# Patient Record
Sex: Male | Born: 1960 | Race: White | Hispanic: No | Marital: Married | Smoking: Never smoker
Health system: Southern US, Community
[De-identification: ages and names within clinical notes are randomized; demographics above are authoritative.]

## PROBLEM LIST (undated history)

## (undated) ENCOUNTER — Emergency Department (HOSPITAL_BASED_OUTPATIENT_CLINIC_OR_DEPARTMENT_OTHER): Admission: EM | Payer: 59 | Source: Home / Self Care

## (undated) DIAGNOSIS — G4730 Sleep apnea, unspecified: Secondary | ICD-10-CM

## (undated) DIAGNOSIS — I10 Essential (primary) hypertension: Secondary | ICD-10-CM

## (undated) DIAGNOSIS — E785 Hyperlipidemia, unspecified: Secondary | ICD-10-CM

## (undated) HISTORY — DX: Hyperlipidemia, unspecified: E78.5

## (undated) HISTORY — DX: Sleep apnea, unspecified: G47.30

## (undated) HISTORY — DX: Essential (primary) hypertension: I10

---

## 2020-03-23 ENCOUNTER — Other Ambulatory Visit: Payer: Self-pay | Admitting: Family Medicine

## 2020-03-23 DIAGNOSIS — R1032 Left lower quadrant pain: Secondary | ICD-10-CM

## 2020-04-08 ENCOUNTER — Ambulatory Visit
Admission: RE | Admit: 2020-04-08 | Discharge: 2020-04-08 | Disposition: A | Discharge status: Home / Self Care | Payer: Managed Care, Other (non HMO) | Source: Ambulatory Visit | Attending: Family Medicine | Admitting: Family Medicine

## 2020-04-08 ENCOUNTER — Other Ambulatory Visit: Payer: Self-pay

## 2020-04-08 DIAGNOSIS — R1032 Left lower quadrant pain: Secondary | ICD-10-CM

## 2020-04-08 MED ORDER — IOPAMIDOL (ISOVUE-300) INJECTION 61%
100.0000 mL | Freq: Once | INTRAVENOUS | Status: AC | PRN
  Administered 2020-04-08: 100 mL via INTRAVENOUS

## 2020-08-30 NOTE — Progress Notes (Signed)
 Self Swab Type: Anterior Nasal    Time of patient swab

## 2021-03-19 IMAGING — CT CT ABD-PELV W/ CM
1 of 3 series · 13 of 32 positions shown, 19 images · IV contrast (APPLIED)
Comparison: None.

CLINICAL DATA: Left lower quadrant pain. Previous history of
diverticulitis.

EXAM:
CT ABDOMEN AND PELVIS WITH CONTRAST
TECHNIQUE: Multidetector CT imaging of the abdomen and pelvis was performed
using the standard protocol following bolus administration of
intravenous contrast.
CONTRAST:  100mL R22TW1-AZZ IOPAMIDOL (R22TW1-AZZ) INJECTION 61%

[Series 2: abd/pelvis w/cm · axial · 0.85mm/px · z∈[-479,-29]mm · 13 of 104 slices shown, 19 images]
[im 7/104  soft-tissue]
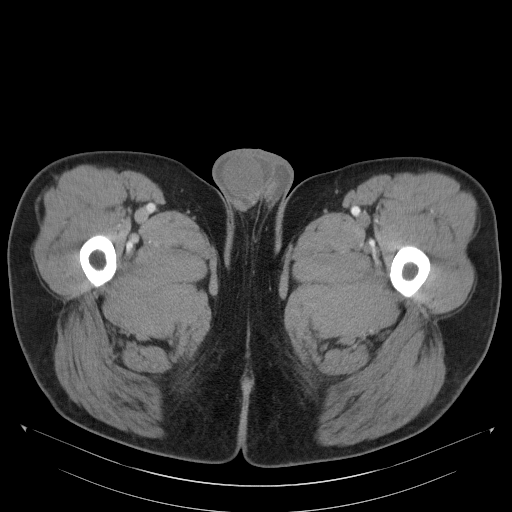
[im 7/104  bone]
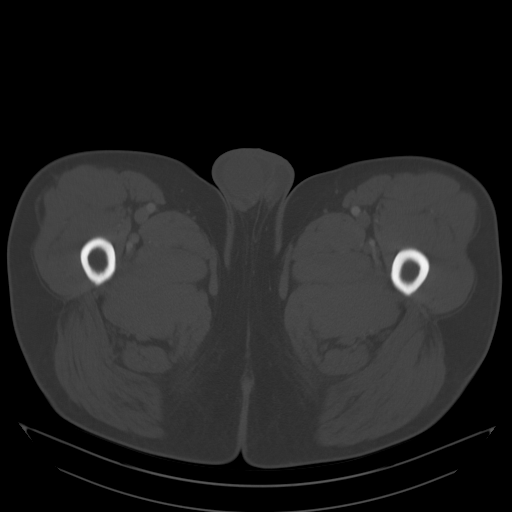
[im 14/104  soft-tissue]
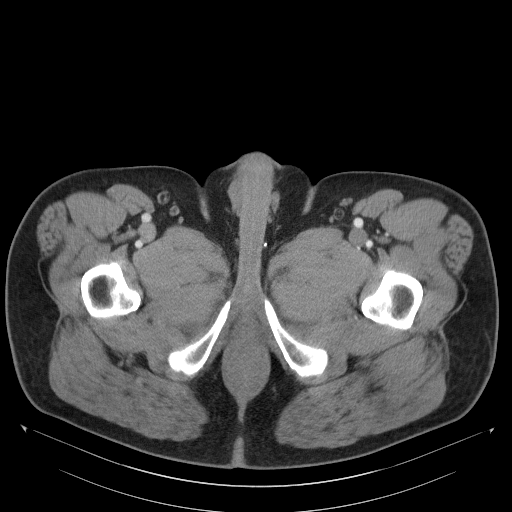
[im 21/104  soft-tissue]
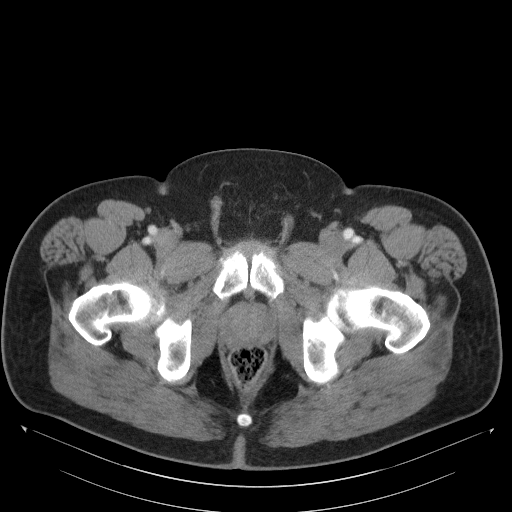
[im 28/104  soft-tissue]
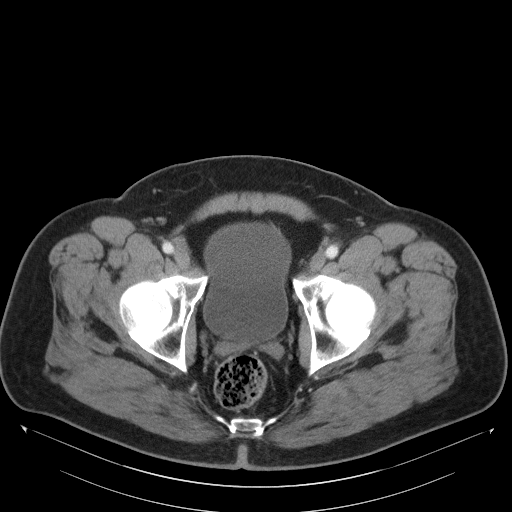
[im 35/104  soft-tissue]
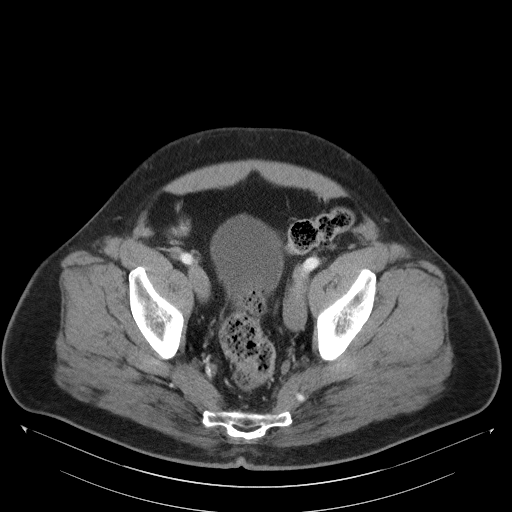
[im 42/104  soft-tissue]
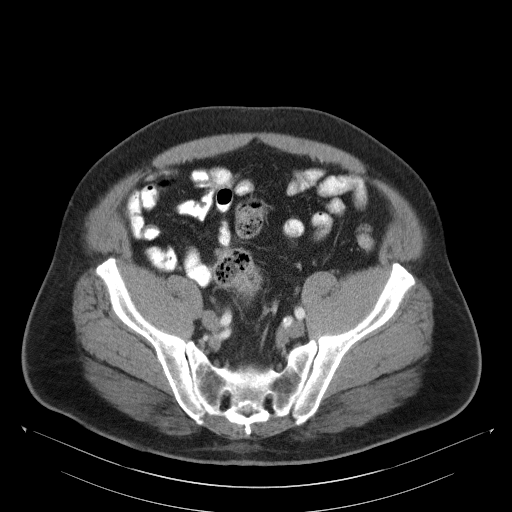
[im 55/104  soft-tissue]
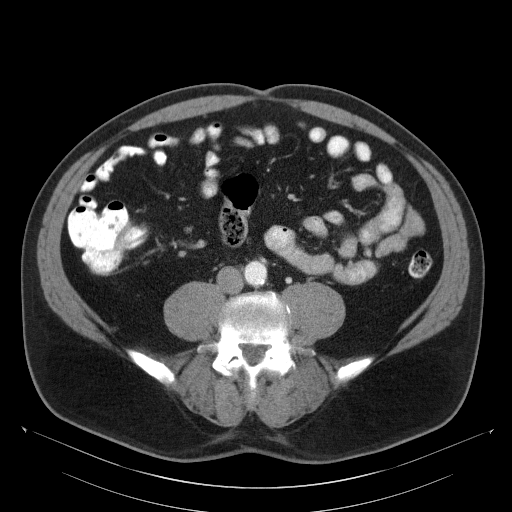
[im 62/104  soft-tissue]
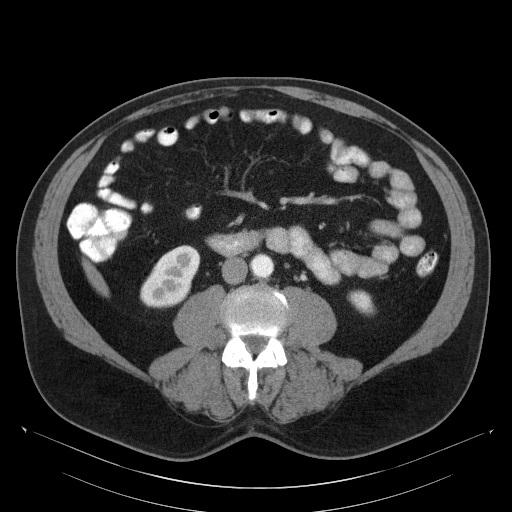
[im 69/104  soft-tissue]
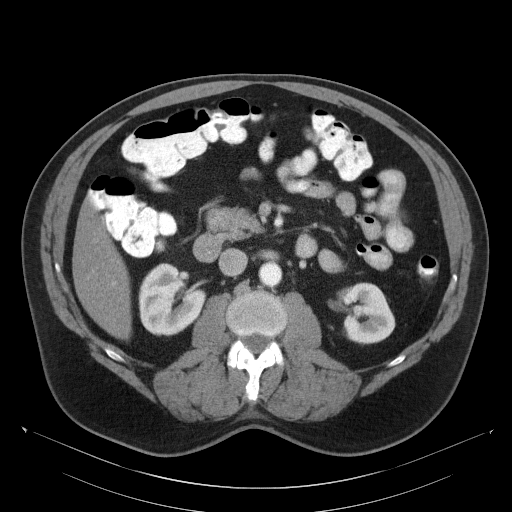
[im 69/104  bone]
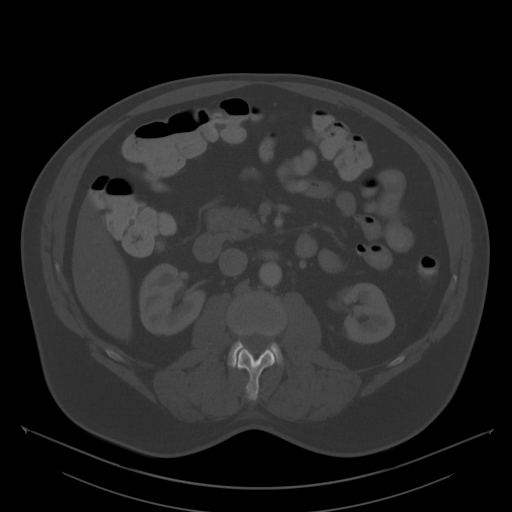
[im 76/104  soft-tissue]
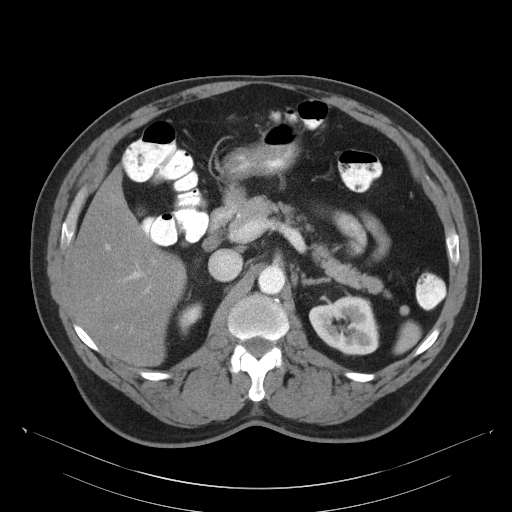
[im 76/104  lung]
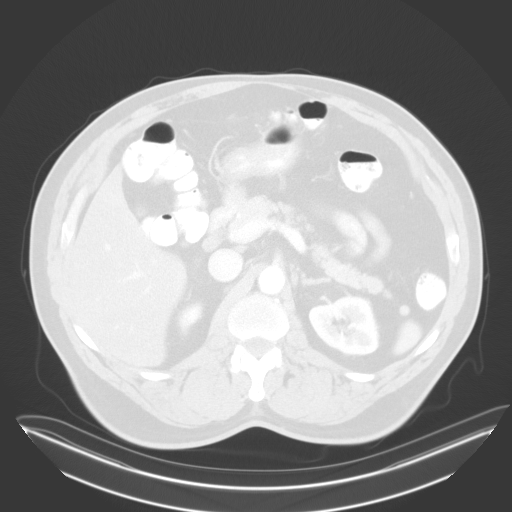
[im 83/104  soft-tissue]
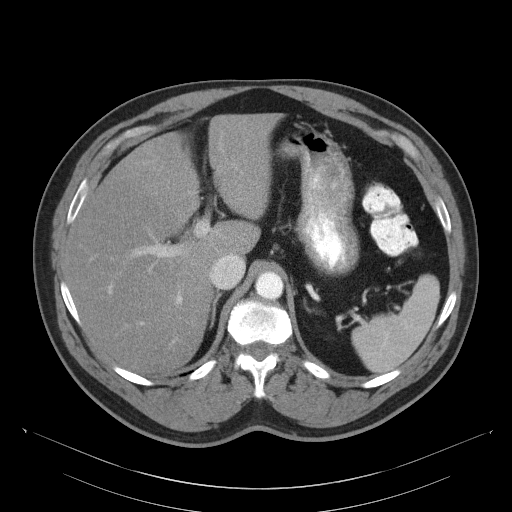
[im 83/104  lung]
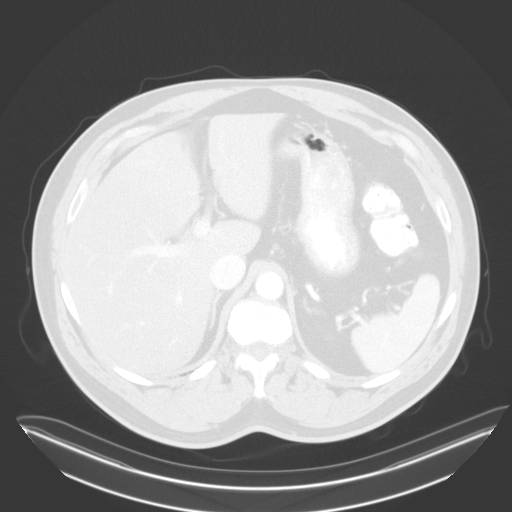
[im 90/104  soft-tissue]
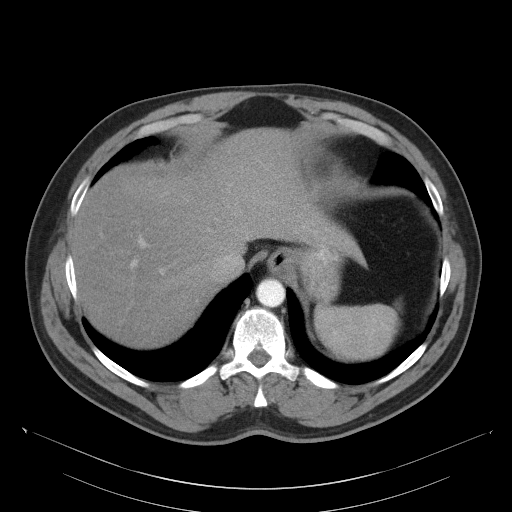
[im 90/104  lung]
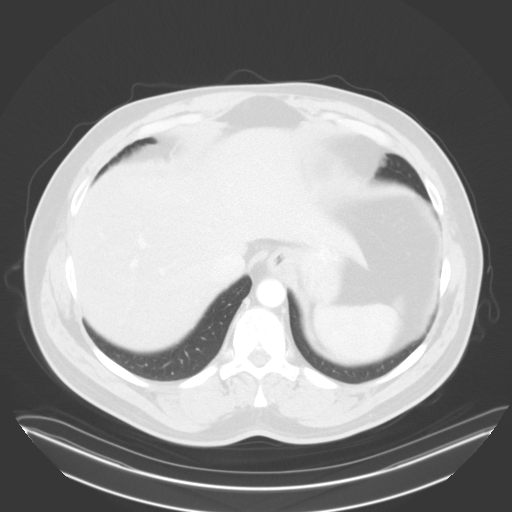
[im 97/104  soft-tissue]
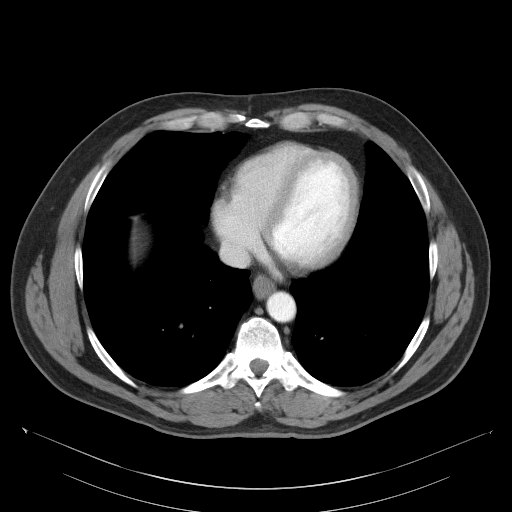
[im 97/104  lung]
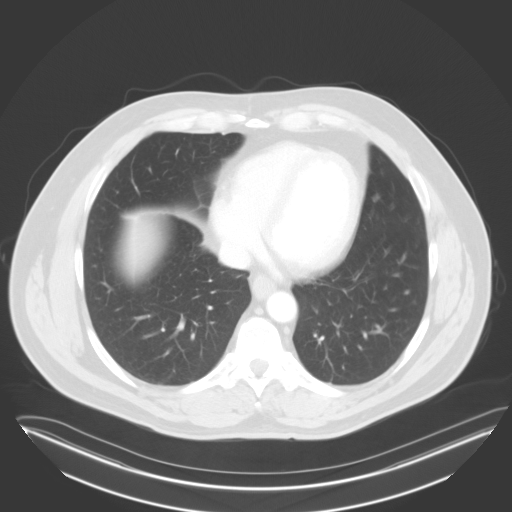

[13 of 32 positions shown; findings below may reference images not displayed]

FINDINGS: Lower Chest: No acute findings.

Hepatobiliary: No hepatic masses identified. Moderate diffuse
hepatic steatosis is seen. Gallbladder is unremarkable. No evidence
of biliary ductal dilatation.

Pancreas:  No mass or inflammatory changes.

Spleen: Within normal limits in size and appearance.

Adrenals/Urinary Tract: No masses identified. No evidence of
ureteral calculi or hydronephrosis.

Stomach/Bowel: No evidence of obstruction, inflammatory process or
abnormal fluid collections. Mild diverticulosis is seen involving
the distal descending and proximal sigmoid colon, however there is
no evidence of diverticulitis.

Vascular/Lymphatic: No pathologically enlarged lymph nodes. No
abdominal aortic aneurysm. Mild aortic atherosclerotic calcification
noted.

Reproductive:  No mass or other significant abnormality.

Other:  None.

Musculoskeletal:  No suspicious bone lesions identified.
IMPRESSION: Mild colonic diverticulosis. No radiographic evidence of
diverticulitis or other acute findings.

Moderate hepatic steatosis.

Aortic Atherosclerosis ([id number].J).

## 2022-08-16 ENCOUNTER — Other Ambulatory Visit: Payer: Self-pay

## 2022-08-16 ENCOUNTER — Encounter (HOSPITAL_BASED_OUTPATIENT_CLINIC_OR_DEPARTMENT_OTHER): Payer: Self-pay | Admitting: Emergency Medicine

## 2022-08-16 ENCOUNTER — Ambulatory Visit (HOSPITAL_BASED_OUTPATIENT_CLINIC_OR_DEPARTMENT_OTHER)
Admission: RE | Admit: 2022-08-16 | Discharge: 2022-08-16 | Disposition: A | Discharge status: Home / Self Care | Payer: 59 | Source: Ambulatory Visit | Attending: Family Medicine | Admitting: Family Medicine

## 2022-08-16 ENCOUNTER — Other Ambulatory Visit (HOSPITAL_BASED_OUTPATIENT_CLINIC_OR_DEPARTMENT_OTHER): Payer: Self-pay | Admitting: Family Medicine

## 2022-08-16 DIAGNOSIS — R0602 Shortness of breath: Secondary | ICD-10-CM | POA: Insufficient documentation

## 2022-08-16 NOTE — ED Triage Notes (Deleted)
Pt arrives to ED with c/o SOB for several weeks that tends to be worse in the mornings.

## 2023-10-08 ENCOUNTER — Inpatient Hospital Stay (HOSPITAL_COMMUNITY)
Admission: EM | Disposition: A | Discharge status: Home / Self Care | Payer: Self-pay | Source: Home / Self Care | Attending: Cardiovascular Disease

## 2023-10-08 ENCOUNTER — Other Ambulatory Visit: Payer: Self-pay

## 2023-10-08 ENCOUNTER — Inpatient Hospital Stay (HOSPITAL_BASED_OUTPATIENT_CLINIC_OR_DEPARTMENT_OTHER)
Admission: EM | Admit: 2023-10-08 | Discharge: 2023-10-08 | DRG: 287 | Disposition: A | Discharge status: Home / Self Care | Attending: Cardiovascular Disease | Admitting: Cardiovascular Disease

## 2023-10-08 ENCOUNTER — Emergency Department (HOSPITAL_BASED_OUTPATIENT_CLINIC_OR_DEPARTMENT_OTHER): Admitting: Radiology

## 2023-10-08 ENCOUNTER — Encounter (HOSPITAL_BASED_OUTPATIENT_CLINIC_OR_DEPARTMENT_OTHER): Payer: Self-pay

## 2023-10-08 ENCOUNTER — Inpatient Hospital Stay (HOSPITAL_COMMUNITY)

## 2023-10-08 DIAGNOSIS — R9431 Abnormal electrocardiogram [ECG] [EKG]: Secondary | ICD-10-CM

## 2023-10-08 DIAGNOSIS — R079 Chest pain, unspecified: Secondary | ICD-10-CM | POA: Diagnosis not present

## 2023-10-08 DIAGNOSIS — I10 Essential (primary) hypertension: Secondary | ICD-10-CM | POA: Diagnosis present

## 2023-10-08 DIAGNOSIS — M26629 Arthralgia of temporomandibular joint, unspecified side: Principal | ICD-10-CM

## 2023-10-08 DIAGNOSIS — Z888 Allergy status to other drugs, medicaments and biological substances status: Secondary | ICD-10-CM

## 2023-10-08 DIAGNOSIS — E785 Hyperlipidemia, unspecified: Secondary | ICD-10-CM | POA: Diagnosis present

## 2023-10-08 DIAGNOSIS — Z79899 Other long term (current) drug therapy: Secondary | ICD-10-CM | POA: Diagnosis not present

## 2023-10-08 DIAGNOSIS — R0789 Other chest pain: Secondary | ICD-10-CM | POA: Diagnosis present

## 2023-10-08 DIAGNOSIS — R6884 Jaw pain: Secondary | ICD-10-CM | POA: Diagnosis present

## 2023-10-08 DIAGNOSIS — G4733 Obstructive sleep apnea (adult) (pediatric): Secondary | ICD-10-CM | POA: Diagnosis present

## 2023-10-08 DIAGNOSIS — I213 ST elevation (STEMI) myocardial infarction of unspecified site: Secondary | ICD-10-CM

## 2023-10-08 HISTORY — PX: LEFT HEART CATH AND CORONARY ANGIOGRAPHY: CATH118249

## 2023-10-08 LAB — RESP PANEL BY RT-PCR (RSV, FLU A&B, COVID)  RVPGX2
Influenza A by PCR: NEGATIVE
Influenza B by PCR: NEGATIVE
Resp Syncytial Virus by PCR: NEGATIVE
SARS Coronavirus 2 by RT PCR: NEGATIVE

## 2023-10-08 LAB — BASIC METABOLIC PANEL WITH GFR
Anion gap: 12 (ref 5–15)
BUN: 20 mg/dL (ref 8–23)
CO2: 18 mmol/L — ABNORMAL LOW (ref 22–32)
Calcium: 9.1 mg/dL (ref 8.9–10.3)
Chloride: 107 mmol/L (ref 98–111)
Creatinine, Ser: 1.26 mg/dL — ABNORMAL HIGH (ref 0.61–1.24)
GFR, Estimated: >60 mL/min (ref >60)
Glucose, Bld: 96 mg/dL (ref 70–99)
Potassium: 4.5 mmol/L (ref 3.5–5.1)
Sodium: 138 mmol/L (ref 135–145)

## 2023-10-08 LAB — MRSA NEXT GEN BY PCR, NASAL: MRSA by PCR Next Gen: NOT DETECTED

## 2023-10-08 LAB — HEMOGLOBIN A1C
Hgb A1c MFr Bld: 5.3 % (ref 4.8–5.6)
Mean Plasma Glucose: 105.41 mg/dL

## 2023-10-08 LAB — LIPID PANEL
Cholesterol: 111 mg/dL (ref 0–200)
HDL: 27 mg/dL — ABNORMAL LOW (ref >40)
LDL Cholesterol: 57 mg/dL (ref 0–99)
Total CHOL/HDL Ratio: 4.1 ratio
Triglycerides: 133 mg/dL (ref <150)
VLDL: 27 mg/dL (ref 0–40)

## 2023-10-08 LAB — LACTIC ACID, PLASMA: Lactic Acid, Venous: 0.7 mmol/L (ref 0.5–1.9)

## 2023-10-08 LAB — CBC WITH DIFFERENTIAL/PLATELET
Abs Immature Granulocytes: 0.01 K/uL (ref 0.00–0.07)
Basophils Absolute: 0.1 K/uL (ref 0.0–0.1)
Basophils Relative: 1 %
Eosinophils Absolute: 0.5 K/uL (ref 0.0–0.5)
Eosinophils Relative: 6 %
HCT: 38.2 % — ABNORMAL LOW (ref 39.0–52.0)
Hemoglobin: 12.8 g/dL — ABNORMAL LOW (ref 13.0–17.0)
Immature Granulocytes: 0 %
Lymphocytes Relative: 32 %
Lymphs Abs: 2.9 K/uL (ref 0.7–4.0)
MCH: 31 pg (ref 26.0–34.0)
MCHC: 33.5 g/dL (ref 30.0–36.0)
MCV: 92.5 fL (ref 80.0–100.0)
Monocytes Absolute: 0.8 K/uL (ref 0.1–1.0)
Monocytes Relative: 9 %
Neutro Abs: 4.8 K/uL (ref 1.7–7.7)
Neutrophils Relative %: 52 %
Platelets: 275 K/uL (ref 150–400)
RBC: 4.13 MIL/uL — ABNORMAL LOW (ref 4.22–5.81)
RDW: 13.9 % (ref 11.5–15.5)
WBC: 9.1 K/uL (ref 4.0–10.5)
nRBC: 0 % (ref 0.0–0.2)

## 2023-10-08 LAB — HEPATIC FUNCTION PANEL
ALT: 22 U/L (ref 0–44)
AST: 29 U/L (ref 15–41)
Albumin: 4.3 g/dL (ref 3.5–5.0)
Alkaline Phosphatase: 124 U/L (ref 38–126)
Bilirubin, Direct: 0.1 mg/dL (ref 0.0–0.2)
Indirect Bilirubin: 0.2 mg/dL — ABNORMAL LOW (ref 0.3–0.9)
Total Bilirubin: 0.3 mg/dL (ref 0.0–1.2)
Total Protein: 7 g/dL (ref 6.5–8.1)

## 2023-10-08 LAB — ECHOCARDIOGRAM COMPLETE
Area-P 1/2: 4.06 cm2
Height: 71 in
S' Lateral: 2.9 cm
Weight: 3425.07 [oz_av]

## 2023-10-08 LAB — TROPONIN T, HIGH SENSITIVITY: Troponin T High Sensitivity: <15 ng/L (ref <19)

## 2023-10-08 SURGERY — LEFT HEART CATH AND CORONARY ANGIOGRAPHY
Anesthesia: LOCAL

## 2023-10-08 MED ORDER — HEPARIN SODIUM (PORCINE) 5000 UNIT/ML IJ SOLN
4000.0000 [IU] | Freq: Once | INTRAMUSCULAR | Status: AC
  Administered 2023-10-08: 4000 [IU] via INTRAVENOUS
  Filled 2023-10-08: qty 1

## 2023-10-08 MED ORDER — SODIUM CHLORIDE 0.9 % IV SOLN: INTRAVENOUS | Status: AC

## 2023-10-08 MED ORDER — HEPARIN SODIUM (PORCINE) 1000 UNIT/ML IJ SOLN
INTRAMUSCULAR | Status: DC | PRN
  Administered 2023-10-08: 3000 [IU] via INTRAVENOUS

## 2023-10-08 MED ORDER — SODIUM CHLORIDE 0.9 % IV SOLN
250.0000 mL | INTRAVENOUS | Status: DC | PRN
Start: 2023-10-08 — End: 2023-10-08

## 2023-10-08 MED ORDER — SODIUM CHLORIDE 0.9% FLUSH: 3.0000 mL | INTRAVENOUS | Status: DC | PRN

## 2023-10-08 MED ORDER — FENTANYL CITRATE (PF) 100 MCG/2ML IJ SOLN
INTRAMUSCULAR | Status: DC | PRN
  Administered 2023-10-08: 50 ug via INTRAVENOUS

## 2023-10-08 MED ORDER — HEPARIN (PORCINE) IN NACL 1000-0.9 UT/500ML-% IV SOLN
INTRAVENOUS | Status: DC | PRN
  Administered 2023-10-08 (×2): 500 mL

## 2023-10-08 MED ORDER — ACETAMINOPHEN 325 MG PO TABS: 650.0000 mg | ORAL_TABLET | ORAL | Status: DC | PRN

## 2023-10-08 MED ORDER — SODIUM CHLORIDE 0.9 % IV SOLN
INTRAVENOUS | Status: AC | PRN
  Administered 2023-10-08: 10 mL/h via INTRAVENOUS

## 2023-10-08 MED ORDER — CHLORHEXIDINE GLUCONATE CLOTH 2 % EX PADS
6.0000 | MEDICATED_PAD | Freq: Every day | CUTANEOUS | Status: DC
  Administered 2023-10-08: 6 via TOPICAL

## 2023-10-08 MED ORDER — LIDOCAINE HCL (PF) 1 % IJ SOLN
INTRAMUSCULAR | Status: AC
  Filled 2023-10-08: qty 30

## 2023-10-08 MED ORDER — SODIUM CHLORIDE 0.9% FLUSH
3.0000 mL | Freq: Two times a day (BID) | INTRAVENOUS | Status: DC
  Administered 2023-10-08: 3 mL via INTRAVENOUS

## 2023-10-08 MED ORDER — ASPIRIN 81 MG PO CHEW
324.0000 mg | CHEWABLE_TABLET | Freq: Once | ORAL | Status: AC
  Administered 2023-10-08: 324 mg via ORAL
  Filled 2023-10-08: qty 4

## 2023-10-08 MED ORDER — MIDAZOLAM HCL 2 MG/2ML IJ SOLN
INTRAMUSCULAR | Status: AC
  Filled 2023-10-08: qty 2

## 2023-10-08 MED ORDER — VERAPAMIL HCL 2.5 MG/ML IV SOLN
INTRAVENOUS | Status: AC
  Filled 2023-10-08: qty 2

## 2023-10-08 MED ORDER — IOHEXOL 350 MG/ML SOLN
INTRAVENOUS | Status: DC | PRN
  Administered 2023-10-08: 35 mL via INTRA_ARTERIAL

## 2023-10-08 MED ORDER — HEPARIN SODIUM (PORCINE) 1000 UNIT/ML IJ SOLN
INTRAMUSCULAR | Status: AC
  Filled 2023-10-08: qty 10

## 2023-10-08 MED ORDER — MIDAZOLAM HCL 2 MG/2ML IJ SOLN
INTRAMUSCULAR | Status: DC | PRN
  Administered 2023-10-08: 2 mg via INTRAVENOUS

## 2023-10-08 MED ORDER — VERAPAMIL HCL 2.5 MG/ML IV SOLN
INTRAVENOUS | Status: DC | PRN
  Administered 2023-10-08: 10 mL via INTRA_ARTERIAL

## 2023-10-08 MED ORDER — METOPROLOL SUCCINATE ER 25 MG PO TB24: 12.5000 mg | ORAL_TABLET | Freq: Every day | ORAL | 0 refills | Status: DC

## 2023-10-08 MED ORDER — HYDRALAZINE HCL 20 MG/ML IJ SOLN: 10.0000 mg | INTRAMUSCULAR | Status: AC | PRN

## 2023-10-08 MED ORDER — EZETIMIBE 10 MG PO TABS: 10.0000 mg | ORAL_TABLET | Freq: Every day | ORAL | 2 refills | Status: AC

## 2023-10-08 MED ORDER — SODIUM CHLORIDE 0.9 % IV SOLN
INTRAVENOUS | Status: DC
  Administered 2023-10-08: 10 mL/h via INTRAVENOUS

## 2023-10-08 MED ORDER — ONDANSETRON HCL 4 MG/2ML IJ SOLN: 4.0000 mg | Freq: Four times a day (QID) | INTRAMUSCULAR | Status: DC | PRN

## 2023-10-08 MED ORDER — LABETALOL HCL 5 MG/ML IV SOLN: 10.0000 mg | INTRAVENOUS | Status: AC | PRN

## 2023-10-08 MED ORDER — PERFLUTREN LIPID MICROSPHERE
1.0000 mL | INTRAVENOUS | Status: AC | PRN
  Administered 2023-10-08: 2 mL via INTRAVENOUS

## 2023-10-08 MED ORDER — HEPARIN (PORCINE) 25000 UT/250ML-% IV SOLN: 1200.0000 [IU]/h | INTRAVENOUS | Status: DC

## 2023-10-08 MED ORDER — FENTANYL CITRATE (PF) 100 MCG/2ML IJ SOLN
INTRAMUSCULAR | Status: AC
  Filled 2023-10-08: qty 2

## 2023-10-08 SURGICAL SUPPLY — 7 items
CATH 5FR JL3.5 JR4 ANG PIG MP (CATHETERS) IMPLANT
DEVICE RAD COMP TR BAND LRG (VASCULAR PRODUCTS) IMPLANT
GLIDESHEATH SLEND SS 6F .021 (SHEATH) IMPLANT
GUIDEWIRE INQWIRE 1.5J.035X260 (WIRE) IMPLANT
PACK CARDIAC CATHETERIZATION (CUSTOM PROCEDURE TRAY) ×1 IMPLANT
SET ATX-X65L (MISCELLANEOUS) IMPLANT
STATION PROTECTION PRESSURIZED (MISCELLANEOUS) IMPLANT

## 2023-10-08 NOTE — H&P (Signed)
 Cardiology Admission History and Physical   Patient ID: Stephone Gum MRN: [id number]; DOB: [birth year]   Admission date: 10/08/2023  PCP:  Pcp, No    HeartCare Providers  Chief Complaint:  Jaw pain   History of Present Illness: Mr. Pewitt is a 63 yo male with OSA, HTN and HLD who began having jaw pain earlier today. He went into the [facility] ED and EKG showed 1 mm ST elevation in leads 1, AVL. No chest pain, dyspnea or diaphoresis. Code STEMI called by ED staff. Pt with mild jaw pain on arrival to [facility].    Past Medical History:  Diagnosis Date   Hyperlipidemia    Hypertension    Sleep apnea    History reviewed. No pertinent surgical history.   Medications Prior to Admission: Prior to Admission medications   Medication Sig Start Date End Date Taking? Authorizing Provider  amLODipine (NORVASC) 5 MG tablet Take 5 mg by mouth daily. 09/23/23  Yes [provider]  Pitavastatin Calcium 4 MG TABS Take 1 tablet by mouth at bedtime. 09/24/23  Yes [provider]  lisinopril (ZESTRIL) 40 MG tablet Take 40 mg by mouth every morning.    [provider]  metoprolol  tartrate (LOPRESSOR ) 25 MG tablet Take 25 mg by mouth 2 (two) times daily.    [provider]  omeprazole (PRILOSEC) 20 MG capsule Take 20 mg by mouth daily.    [provider]     Allergies:    Allergies  Allergen Reactions   Statins Other (See Comments)    cramps, muscle aches, tinnitus    Social History:   Social History   Socioeconomic History   Marital status: Married    Spouse name: Not on file   Number of children: Not on file   Years of education: Not on file   Highest education level: Not on file  Occupational History   Not on file  Tobacco Use   Smoking status: Never   Smokeless tobacco: Never  Substance and Sexual Activity   Alcohol use: Not on file   Drug use: Not on file   Sexual activity: Not on file  Other Topics Concern   Not on  file  Social History Narrative   Not on file   Social Drivers of Health   Financial Resource Strain: Not on file  Food Insecurity: No Food Insecurity (10/08/2023)   Hunger Vital Sign    Worried About Running Out of Food in the Last Year: Never true    Ran Out of Food in the Last Year: Never true  Transportation Needs: No Transportation Needs (10/08/2023)   PRAPARE - Administrator, Civil Service (Medical): No    Lack of Transportation (Non-Medical): No  Physical Activity: Not on file  Stress: Not on file  Social Connections: Not on file  Intimate Partner Violence: Not At Risk (10/08/2023)   Humiliation, Afraid, Rape, and Kick questionnaire    Fear of Current or Ex-Partner: No    Emotionally Abused: No    Physically Abused: No    Sexually Abused: No     Family History:   The patient's family history is not on file.    ROS:  Please see the history of present illness.  All other ROS reviewed and negative.     Physical Exam/Data: Vitals:   10/08/23 0515 10/08/23 0545 10/08/23 0600 10/08/23 0700  BP: 124/83 123/85 (!) 116/99 (!) 126/95  Pulse: (!) 57 (!) 52 (!) 47 (!) 50  Resp: 12 13 13  (!) 9  Temp:    98 F (36.7 C)  TempSrc:    Axillary  SpO2: 98% 98% 100% 100%  Weight:    97.1 kg  Height:        Intake/Output Summary (Last 24 hours) at 10/08/2023 0811 Last data filed at 10/08/2023 0743 Gross per 24 hour  Intake 203.87 ml  Output 550 ml  Net -346.13 ml      10/08/2023    7:00 AM 10/08/2023    3:23 AM  Last 3 Weights  Weight (lbs) 214 lb 1.1 oz 230 lb  Weight (kg) 97.1 kg 104.327 kg     Body mass index is 29.86 kg/m.  General:  Well nourished, well developed, in no acute distress HEENT: normal Neck: no JVD Vascular: No carotid bruits; Distal pulses 2+ bilaterally   Cardiac:  normal S1, S2; RRR; no murmur  Lungs:  clear to auscultation bilaterally, no wheezing, rhonchi or rales  Abd: soft, nontender, no hepatomegaly  Ext: no LE  edema Musculoskeletal:  No deformities, BUE and BLE strength normal and equal Skin: warm and dry  Neuro:  CNs 2-12 intact, no focal abnormalities noted Psych:  Normal affect   EKG:  The ECG that was done was personally reviewed and demonstrates sinus with 1 mm ST elevation 1, AVL  Relevant CV Studies:   Laboratory Data: High Sensitivity Troponin:  No results for input(s): TROPONINIHS in the last 720 hours.    Chemistry Recent Labs  Lab 10/08/23 0356  NA 138  K 4.5  CL 107  CO2 18*  GLUCOSE 96  BUN 20  CREATININE 1.26*  CALCIUM 9.1  GFRNONAA >60  ANIONGAP 12    Recent Labs  Lab 10/08/23 0356  PROT 7.0  ALBUMIN 4.3  AST 29  ALT 22  ALKPHOS 124  BILITOT 0.3   Lipids No results for input(s): CHOL, TRIG, HDL, LABVLDL, LDLCALC, CHOLHDL in the last 168 hours. Hematology Recent Labs  Lab 10/08/23 0356  WBC 9.1  RBC 4.13*  HGB 12.8*  HCT 38.2*  MCV 92.5  MCH 31.0  MCHC 33.5  RDW 13.9  PLT 275   Thyroid No results for input(s): TSH, FREET4 in the last 168 hours. BNPNo results for input(s): BNP, PROBNP in the last 168 hours.  DDimer No results for input(s): DDIMER in the last 168 hours.  Radiology/Studies:  CARDIAC CATHETERIZATION Result Date: 10/08/2023 No angiographic evidence of CAD Normal LV function LVEDP 14 mmHg Recommendations: No further ischemia workup. Will admit this morning. Likely discharge later today.   DG Chest 2 View Result Date: 10/08/2023 CLINICAL DATA:  63 year old male with cough, left side jaw pain since last night. EXAM: CHEST - 2 VIEW COMPARISON:  Chest radiograph 08/16/2022. FINDINGS: PA and lateral views. Lung volumes and mediastinal contours remain normal. Visualized tracheal air column is within normal limits. Lung markings appear stable from last year and both lungs appear clear. No pneumothorax or pleural effusion. No acute osseous abnormality identified. Negative visible bowel gas. IMPRESSION: No acute  cardiopulmonary abnormality. Electronically Signed   By: VEAR Hurst M.D.   On: 10/08/2023 03:55     Assessment and Plan: Abnormal EKG: The patient has a mildly abnormal EKG and jaw pain. Plan emergent cardiac cath.    Code Status: Full Code  Severity of Illness: The appropriate patient status for this patient is INPATIENT. Inpatient status is judged to be reasonable and necessary in order to provide the required intensity of service to  ensure the patient's safety. The patient's presenting symptoms, physical exam findings, and initial radiographic and laboratory data in the context of their chronic comorbidities is felt to place them at high risk for further clinical deterioration. Furthermore, it is not anticipated that the patient will be medically stable for discharge from the hospital within 2 midnights of admission.   * I certify that at the point of admission it is my clinical judgment that the patient will require inpatient hospital care spanning beyond 2 midnights from the point of admission due to high intensity of service, high risk for further deterioration and high frequency of surveillance required.*  For questions or updates, please contact [facility] Please consult [email or web address] for contact info under     Signed, Lonni Cash, MD  10/08/2023 8:11 AM

## 2023-10-08 NOTE — Progress Notes (Signed)
  Progress Note  Patient Name: Vincent Davies Date of Encounter: 10/08/2023 [facility] Cardiologist: None new  Interval Summary   No chest pain or dyspnea. Still with left jaw pain at angle. Worse with palpation  Vital Signs Vitals:   10/08/23 0515 10/08/23 0545 10/08/23 0600 10/08/23 0700  BP: 124/83 123/85 (!) 116/99 (!) 126/95  Pulse: (!) 57 (!) 52 (!) 47 (!) 50  Resp: 12 13 13  (!) 9  Temp:    98 F (36.7 C)  TempSrc:    Axillary  SpO2: 98% 98% 100% 100%  Weight:    97.1 kg  Height:        Intake/Output Summary (Last 24 hours) at 10/08/2023 0750 Last data filed at 10/08/2023 0743 Gross per 24 hour  Intake 203.87 ml  Output 550 ml  Net -346.13 ml      10/08/2023    7:00 AM 10/08/2023    3:23 AM  Last 3 Weights  Weight (lbs) 214 lb 1.1 oz 230 lb  Weight (kg) 97.1 kg 104.327 kg      Telemetry/ECG  NSR. One brief pause  - Personally Reviewed  Physical Exam  GEN: No acute distress.   Neck: No JVD Cardiac: RRR, no murmurs, rubs, or gallops.  Respiratory: Clear to auscultation bilaterally. GI: Soft, nontender, non-distended  MS: No edema radial band in place  Assessment & Plan  Jaw pain - musculoskeletal by exam. Recommend NSAID or tylenol  for pain.  Abnormal Ecg. Suspect early repolarization variant. Normal cardiac cath. No further evaluation needed HTN. Continue home meds OSA on home CPAP. Suspect pause may have been related.   Plan: DC home today with follow up by PCP    For questions or updates, please contact [facility] Please consult [email or web address] for contact info under       Signed, Zaire Vanbuskirk [place], MD

## 2023-10-08 NOTE — ED Triage Notes (Signed)
 Pt presents via POV c/o left sided jaw pain since last night. Reports painful to palpation and movement.

## 2023-10-08 NOTE — Progress Notes (Signed)
  Echocardiogram 2D Echocardiogram has been performed.  Tinnie FORBES Gosling RDCS 10/08/2023, 9:30 AM

## 2023-10-08 NOTE — ED Notes (Signed)
 EMS at bedside to transport patient. Unable to transport with heparin  gtt. Dr. Jerrol agrees to hold heparin  gtt until arrival to [facility].

## 2023-10-08 NOTE — Plan of Care (Signed)
  Problem: Education: Goal: Knowledge of General Education information will improve Description: Including pain rating scale, medication(s)/side effects and non-pharmacologic comfort measures Outcome: Adequate for Discharge   Problem: Health Behavior/Discharge Planning: Goal: Ability to manage health-related needs will improve Outcome: Adequate for Discharge   Problem: Clinical Measurements: Goal: Ability to maintain clinical measurements within normal limits will improve Outcome: Adequate for Discharge Goal: Will remain free from infection Outcome: Adequate for Discharge Goal: Diagnostic test results will improve Outcome: Adequate for Discharge Goal: Respiratory complications will improve Outcome: Adequate for Discharge Goal: Cardiovascular complication will be avoided Outcome: Adequate for Discharge   Problem: Activity: Goal: Risk for activity intolerance will decrease Outcome: Adequate for Discharge   Problem: Nutrition: Goal: Adequate nutrition will be maintained Outcome: Adequate for Discharge   Problem: Coping: Goal: Level of anxiety will decrease Outcome: Adequate for Discharge   Problem: Elimination: Goal: Will not experience complications related to bowel motility Outcome: Adequate for Discharge Goal: Will not experience complications related to urinary retention Outcome: Adequate for Discharge   Problem: Pain Managment: Goal: General experience of comfort will improve and/or be controlled Outcome: Adequate for Discharge   Problem: Safety: Goal: Ability to remain free from injury will improve Outcome: Adequate for Discharge   Problem: Skin Integrity: Goal: Risk for impaired skin integrity will decrease Outcome: Adequate for Discharge   Problem: Education: Goal: Understanding of CV disease, CV risk reduction, and recovery process will improve Outcome: Adequate for Discharge Goal: Individualized Educational Video(s) Outcome: Adequate for Discharge    Problem: Activity: Goal: Ability to return to baseline activity level will improve Outcome: Adequate for Discharge   Problem: Cardiovascular: Goal: Ability to achieve and maintain adequate cardiovascular perfusion will improve Outcome: Adequate for Discharge Goal: Vascular access site(s) Level 0-1 will be maintained Outcome: Adequate for Discharge   Problem: Health Behavior/Discharge Planning: Goal: Ability to safely manage health-related needs after discharge will improve Outcome: Adequate for Discharge   Problem: Education: Goal: Understanding of CV disease, CV risk reduction, and recovery process will improve Outcome: Adequate for Discharge Goal: Individualized Educational Video(s) Outcome: Adequate for Discharge   Problem: Activity: Goal: Ability to return to baseline activity level will improve Outcome: Adequate for Discharge   Problem: Cardiovascular: Goal: Ability to achieve and maintain adequate cardiovascular perfusion will improve Outcome: Adequate for Discharge Goal: Vascular access site(s) Level 0-1 will be maintained Outcome: Adequate for Discharge   Problem: Health Behavior/Discharge Planning: Goal: Ability to safely manage health-related needs after discharge will improve Outcome: Adequate for Discharge

## 2023-10-08 NOTE — TOC CM/SW Note (Addendum)
 Transition of Care [facility]) - Inpatient Brief Assessment   Patient Details  Name: Micahel Omlor MRN: [id number] Date of Birth: 03/07/1961  Transition of [facility] CM/SW Contact:    Sudie Erminio Deems, RN Phone Number: 10/08/2023, 11:02 AM   Clinical Narrative: Patient presented for jaw and chest pain. PTA patient was independent from home with spouse. Patient states he has a provider at [facility]. No home needs identified at this time. Spouse to provide transportation home.    Transition of Care Asessment: Insurance and Status: Insurance coverage has been reviewed Patient has primary care physician: Yes [facility] Darice Marrow.) Home environment has been reviewed: reviewed Prior level of function:: independent Prior/Current Home Services: No current home services Social Drivers of Health Review: SDOH reviewed no interventions necessary Readmission risk has been reviewed: Yes Transition of care needs: no transition of care needs at this time

## 2023-10-08 NOTE — ED Provider Notes (Signed)
 [place] EMERGENCY DEPARTMENT AT [facility] Provider Note   CSN: [id number] Arrival date & time: 10/08/23  [id number]     Patient presents with: Jaw Pain   Vincent Davies is a 63 y.o. male.   HPI   63 year old male with medical history significant for OSA, HLD, HTN presenting to the emergency department with concern for jaw pain.  The patient states that he developed left-sided jaw pain that started last night.  He was singing when it came on.  He denies any trauma to the face.  He denies any chest pain or shortness of breath.  He also states that he has had a cough that is mildly productive of sputum for the past few weeks.  He denies any fevers or chills.  Pain in the jaw is worse with movement of the jaw.  It is also tender to palpation.  He endorses a sensation of fullness in the ear as well.  He denies any sinus pain or pressure.  He denies any swelling in his face.  He denies any tooth pain or swelling.  Prior to Admission medications   Not on File    Allergies: Statins    Review of Systems  All other systems reviewed and are negative.   Updated Vital Signs BP 134/88   Pulse 61   Temp 98.1 F (36.7 C)   Resp 16   Ht 5' 11 (1.803 m)   Wt 104.3 kg   SpO2 100%   BMI 32.08 kg/m   Physical Exam Vitals and nursing note reviewed.  Constitutional:      General: He is not in acute distress.    Appearance: He is well-developed.  HENT:     Head: Normocephalic and atraumatic.     Comments: TTP about the TMJ on the left, no erythema or swelling. No mastoid swelling.TMs appear normal bilaterally     Right Ear: Tympanic membrane, ear canal and external ear normal.     Left Ear: Tympanic membrane, ear canal and external ear normal.  Eyes:     Conjunctiva/sclera: Conjunctivae normal.  Cardiovascular:     Rate and Rhythm: Normal rate and regular rhythm.     Heart sounds: No murmur heard. Pulmonary:     Effort: Pulmonary effort is normal. No respiratory distress.      Breath sounds: Normal breath sounds.  Abdominal:     Palpations: Abdomen is soft.     Tenderness: There is no abdominal tenderness.  Musculoskeletal:        General: No swelling.     Cervical back: Neck supple.  Skin:    General: Skin is warm and dry.     Capillary Refill: Capillary refill takes less than 2 seconds.  Neurological:     Mental Status: He is alert.  Psychiatric:        Mood and Affect: Mood normal.     (all labs ordered are listed, but only abnormal results are displayed) Labs Reviewed  CBC WITH DIFFERENTIAL/PLATELET - Abnormal; Notable for the following components:      Result Value   RBC 4.13 (*)    Hemoglobin 12.8 (*)    HCT 38.2 (*)    All other components within normal limits  RESP PANEL BY RT-PCR (RSV, FLU A&B, COVID)  RVPGX2  BASIC METABOLIC PANEL WITH GFR  HEMOGLOBIN A1C  PROTIME-INR  APTT  LIPID PANEL  LACTIC ACID, PLASMA  HEPATIC FUNCTION PANEL  TROPONIN T, HIGH SENSITIVITY    EKG: EKG Interpretation Date/Time:  Tuesday October 08 2023 03:51:22 EDT Ventricular Rate:  54 PR Interval:  194 QRS Duration:  98 QT Interval:  413 QTC Calculation: 392 R Axis:   12  Text Interpretation: Sinus bradycardia Lateral infarct, acute (LAD) >>> Acute MI <<< Discussed with Dr. Verlin, STEMI Reconfirmed by Jerrol Agent ([id number]) on 10/08/2023 4:21:14 AM  Radiology: [facility] Chest 2 View Result Date: 10/08/2023 CLINICAL DATA:  63 year old male with cough, left side jaw pain since last night. EXAM: CHEST - 2 VIEW COMPARISON:  Chest radiograph 08/16/2022. FINDINGS: PA and lateral views. Lung volumes and mediastinal contours remain normal. Visualized tracheal air column is within normal limits. Lung markings appear stable from last year and both lungs appear clear. No pneumothorax or pleural effusion. No acute osseous abnormality identified. Negative visible bowel gas. IMPRESSION: No acute cardiopulmonary abnormality. Electronically Signed   By: VEAR Hurst M.D.   On:  10/08/2023 03:55     .Critical Care  Performed by: Jerrol Agent, MD Authorized by: Jerrol Agent, MD   Critical care provider statement:    Critical care time (minutes):  30   Critical care was time spent personally by me on the following activities:  Development of treatment plan with patient or surrogate, discussions with consultants, evaluation of patient's response to treatment, examination of patient, ordering and review of laboratory studies, ordering and review of radiographic studies, ordering and performing treatments and interventions, pulse oximetry, re-evaluation of patient's condition and review of old charts   Care discussed with: accepting provider at another facility      Medications Ordered in the ED  0.9 %  sodium chloride  infusion (10 mL/hr Intravenous New Bag/Given 10/08/23 0415)  heparin  ADULT infusion 100 units/mL (25000 units/259mL) (has no administration in time range)  aspirin  chewable tablet 324 mg (324 mg Oral Given 10/08/23 0410)  heparin  injection 4,000 Units (4,000 Units Intravenous Given 10/08/23 0411)                                    Medical Decision Making Amount and/or Complexity of Data Reviewed Labs: ordered. Radiology: ordered.  Risk OTC drugs. Prescription drug management.     63 year old male with medical history significant for OSA, HLD, HTN presenting to the emergency department with concern for jaw pain.  The patient states that he developed left-sided jaw pain that started last night.  He was singing when it came on.  He denies any trauma to the face.  He denies any chest pain or shortness of breath.  He also states that he has had a cough that is mildly productive of sputum for the past few weeks.  He denies any fevers or chills.  Pain in the jaw is worse with movement of the jaw.  It is also tender to palpation.  He endorses a sensation of fullness in the ear as well.  He denies any sinus pain or pressure.  He denies any swelling in  his face.  He denies any tooth pain or swelling.  On arrival, the patient was vitally stable.  Physical exam revealed TMs normal bilaterally, tenderness to palpation about the left TMJ.  Primary concern is for TMJ syndrome.  Given the patient's left-sided jaw pain however, will obtain screening labs and EKG to rule out ACS.  Given the patient's cough for the past few weeks that is productive of sputum, will obtain chest x-ray as well, swab for COVID and flu.  Very low suspicion for PE, patient without dry cough, no pleuritic chest pain, not short of breath.  Cough has been productive.  No fevers or chills, patient not meeting SIRS criteria on arrival.  EKG: Patient with greater than 1 mm ST segment elevations in leads I and aVL, no prior EKGs for comparison unfortunately. Discussed with on-call STEMI doc, Dr. Verlin, will take to the cath lab.  CXR:  IMPRESSION:  No acute cardiopulmonary abnormality.    Labs: Obtained, pending at time of transfer. Pt administered ASA 324mg  and a heparin  bolus 4000 units, heparin  infusion ordered. Transferred to [facility] for emergent cardiac catheterization, EMS contacted as [facility] did not immediately have an available truck.     Final diagnoses:  ST elevation myocardial infarction (STEMI), unspecified artery [facility])    ED Discharge Orders     None          Jerrol Agent, MD 10/08/23 0425

## 2023-10-08 NOTE — Discharge Summary (Signed)
 Discharge Summary   Patient ID: Vincent Davies MRN: [id number]; DOB: October 26, 1960  Admit date: 10/08/2023 Discharge date: 10/08/2023  PCP:  Freddrick No    [facility] Providers Cardiologist:  Lonni Cash, MD     Discharge Diagnoses  Principal Problem:   Chest pain Active Problems:   Hypertension   Hyperlipidemia  Diagnostic Studies/Procedures   Cath: 10/08/2023  No angiographic evidence of CAD Normal LV function LVEDP 14 mmHg   Recommendations: No further ischemia workup. Will admit this morning. Likely discharge later today.    Echo: 10/08/2023  IMPRESSIONS     1. Left ventricular ejection fraction, by estimation, is 55 to 60%. The  left ventricle has normal function. The left ventricle has no regional  wall motion abnormalities. Left ventricular diastolic parameters were  normal.   2. Right ventricular systolic function is normal. The right ventricular  size is normal.   3. The mitral valve is normal in structure. No evidence of mitral valve  regurgitation. No evidence of mitral stenosis.   4. The aortic valve is tricuspid. Aortic valve regurgitation is not  visualized. No aortic stenosis is present.   5. The inferior vena cava is normal in size with greater than 50%  respiratory variability, suggesting right atrial pressure of 3 mmHg.   Comparison(s): No prior Echocardiogram.   FINDINGS   Left Ventricle: Left ventricular ejection fraction, by estimation, is 55  to 60%. The left ventricle has normal function. The left ventricle has no  regional wall motion abnormalities. The left ventricular internal cavity  size was normal in size. There is   no left ventricular hypertrophy. Left ventricular diastolic parameters  were normal.   Right Ventricle: The right ventricular size is normal. No increase in  right ventricular wall thickness. Right ventricular systolic function is  normal.   Left Atrium: Left atrial size was normal in size.   Right  Atrium: Right atrial size was normal in size.   Pericardium: There is no evidence of pericardial effusion.   Mitral Valve: The mitral valve is normal in structure. No evidence of  mitral valve regurgitation. No evidence of mitral valve stenosis.   Tricuspid Valve: The tricuspid valve is normal in structure. Tricuspid  valve regurgitation is trivial. No evidence of tricuspid stenosis.   Aortic Valve: The aortic valve is tricuspid. Aortic valve regurgitation is  not visualized. No aortic stenosis is present.   Pulmonic Valve: The pulmonic valve was normal in structure. Pulmonic valve  regurgitation is not visualized. No evidence of pulmonic stenosis.   Aorta: The aortic root and ascending aorta are structurally normal, with  no evidence of dilitation.   Venous: The inferior vena cava is normal in size with greater than 50%  respiratory variability, suggesting right atrial pressure of 3 mmHg.   IAS/Shunts: The atrial septum is grossly normal.   _____________   History of Present Illness   [name] is a 63 y.o. male with PMH of OSA, HTN and HLD who began having jaw pain earlier today. He went into [facility] ED and EKG showed 1 mm ST elevation in leads 1, AVL. No chest pain, dyspnea or diaphoresis. Code STEMI called by ED staff. Pt with mild jaw pain on arrival to [facility].  Taken to the Cath Lab for emergent cardiac catheterization.   Hospital Course    Jaw pain Abnormal EKG -- Jaw pain was noted to be reproducible on exam suspected to be musculoskeletal.  In regards to his EKG suspected early repolarization variant  as he had a normal cardiac catheterization.  Hypertension -- Switch to Toprol -XL 12.5 mg daily as he was somewhat bradycardic during admission -- Continue lisinopril 40 mg daily, amlodipine 5 mg daily  Hyperlipidemia -- Intolerant to most statins, currently on pitavastatin, add Zetia  10mg  daily   Patient was seen by Dr. [place] and deemed stable for  discharge home.  Follow-up with PCP.  Did the patient have an acute coronary syndrome (MI, NSTEMI, STEMI, etc) this admission?:  No                               Did the patient have a percutaneous coronary intervention (stent / angioplasty)?:  No.    _____________  Discharge Vitals Blood pressure (!) 126/95, pulse (!) 55, temperature 98 F (36.7 C), temperature source Axillary, resp. rate 13, height 5' 11 (1.803 m), weight 97.1 kg, SpO2 100%.  Filed Weights   10/08/23 0323 10/08/23 0700  Weight: 104.3 kg 97.1 kg    Labs & Radiologic Studies  CBC Recent Labs    10/08/23 0356  WBC 9.1  NEUTROABS 4.8  HGB 12.8*  HCT 38.2*  MCV 92.5  PLT 275   Basic Metabolic Panel Recent Labs    92/70/74 0356  NA 138  K 4.5  CL 107  CO2 18*  GLUCOSE 96  BUN 20  CREATININE 1.26*  CALCIUM 9.1   Liver Function Tests Recent Labs    10/08/23 0356  AST 29  ALT 22  ALKPHOS 124  BILITOT 0.3  PROT 7.0  ALBUMIN 4.3   No results for input(s): LIPASE, AMYLASE in the last 72 hours. High Sensitivity Troponin:   No results for input(s): TROPONINIHS in the last 720 hours.  Recent Labs  Lab 10/08/23 0356  TRNPT <15    BNP Invalid input(s): POCBNP No results for input(s): PROBNP in the last 72 hours.  No results for input(s): BNP in the last 72 hours.  D-Dimer No results for input(s): DDIMER in the last 72 hours. Hemoglobin A1C No results for input(s): HGBA1C in the last 72 hours. Fasting Lipid Panel No results for input(s): CHOL, HDL, LDLCALC, TRIG, CHOLHDL, LDLDIRECT in the last 72 hours. No results found for: LIPOA  Thyroid Function Tests No results for input(s): TSH, T4TOTAL, T3FREE, THYROIDAB in the last 72 hours.  Invalid input(s): FREET3 _____________  ECHOCARDIOGRAM COMPLETE Result Date: 10/08/2023    ECHOCARDIOGRAM REPORT   Patient Name:   Vincent Davies Date of Exam: 10/08/2023 Medical Rec #:  [id number]       Height:       71.0  in Accession #:    [id number]      Weight:       214.1 lb Date of Birth:  06-17-1960      BSA:          2.170 m Patient Age:    63 years        BP:           126/95 mmHg Patient Gender: M               HR:           63 bpm. Exam Location:  Inpatient Procedure: 2D Echo, Cardiac Doppler, Color Doppler and Intracardiac            Opacification Agent (Both Spectral and Color Flow Doppler were            utilized during  procedure). Indications:    Chest Pain R07.9  History:        Patient has no prior history of Echocardiogram examinations.                 Risk Factors:Hypertension and Dyslipidemia.  Sonographer:    Tinnie Gosling RDCS Referring Phys: [id number] CHRISTOPHER D MCALHANY IMPRESSIONS  1. Left ventricular ejection fraction, by estimation, is 55 to 60%. The left ventricle has normal function. The left ventricle has no regional wall motion abnormalities. Left ventricular diastolic parameters were normal.  2. Right ventricular systolic function is normal. The right ventricular size is normal.  3. The mitral valve is normal in structure. No evidence of mitral valve regurgitation. No evidence of mitral stenosis.  4. The aortic valve is tricuspid. Aortic valve regurgitation is not visualized. No aortic stenosis is present.  5. The inferior vena cava is normal in size with greater than 50% respiratory variability, suggesting right atrial pressure of 3 mmHg. Comparison(s): No prior Echocardiogram. FINDINGS  Left Ventricle: Left ventricular ejection fraction, by estimation, is 55 to 60%. The left ventricle has normal function. The left ventricle has no regional wall motion abnormalities. The left ventricular internal cavity size was normal in size. There is  no left ventricular hypertrophy. Left ventricular diastolic parameters were normal. Right Ventricle: The right ventricular size is normal. No increase in right ventricular wall thickness. Right ventricular systolic function is normal. Left Atrium: Left atrial size was  normal in size. Right Atrium: Right atrial size was normal in size. Pericardium: There is no evidence of pericardial effusion. Mitral Valve: The mitral valve is normal in structure. No evidence of mitral valve regurgitation. No evidence of mitral valve stenosis. Tricuspid Valve: The tricuspid valve is normal in structure. Tricuspid valve regurgitation is trivial. No evidence of tricuspid stenosis. Aortic Valve: The aortic valve is tricuspid. Aortic valve regurgitation is not visualized. No aortic stenosis is present. Pulmonic Valve: The pulmonic valve was normal in structure. Pulmonic valve regurgitation is not visualized. No evidence of pulmonic stenosis. Aorta: The aortic root and ascending aorta are structurally normal, with no evidence of dilitation. Venous: The inferior vena cava is normal in size with greater than 50% respiratory variability, suggesting right atrial pressure of 3 mmHg. IAS/Shunts: The atrial septum is grossly normal.  LEFT VENTRICLE PLAX 2D LVIDd:         5.20 cm   Diastology LVIDs:         2.90 cm   LV e' medial:    7.18 cm/s LV PW:         1.00 cm   LV E/e' medial:  12.5 LV IVS:        1.00 cm   LV e' lateral:   11.30 cm/s LVOT diam:     2.00 cm   LV E/e' lateral: 7.9 LV SV:         72 LV SV Index:   33 LVOT Area:     3.14 cm  RIGHT VENTRICLE             IVC RV S prime:     10.00 cm/s  IVC diam: 1.70 cm TAPSE (M-mode): 1.9 cm LEFT ATRIUM             Index        RIGHT ATRIUM           Index LA diam:        3.90 cm 1.80 cm/m   RA Area:  13.90 cm LA Vol (A2C):   74.5 ml 34.33 ml/m  RA Volume:   31.70 ml  14.61 ml/m LA Vol (A4C):   48.8 ml 22.48 ml/m LA Biplane Vol: 61.2 ml 28.20 ml/m  AORTIC VALVE LVOT Vmax:   94.20 cm/s LVOT Vmean:  59.600 cm/s LVOT VTI:    0.228 m  AORTA Ao Root diam: 3.20 cm Ao Asc diam:  3.50 cm MITRAL VALVE MV Area (PHT): 4.06 cm    SHUNTS MV Decel Time: 187 msec    Systemic VTI:  0.23 m MV E velocity: 89.60 cm/s  Systemic Diam: 2.00 cm MV A velocity: 63.90  cm/s MV E/A ratio:  1.40 Stanly Leavens MD Electronically signed by Stanly Leavens MD Signature Date/Time: 10/08/2023/10:04:37 AM    Final    CARDIAC CATHETERIZATION Result Date: 10/08/2023 No angiographic evidence of CAD Normal LV function LVEDP 14 mmHg Recommendations: No further ischemia workup. Will admit this morning. Likely discharge later today.   DG Chest 2 View Result Date: 10/08/2023 CLINICAL DATA:  63 year old male with cough, left side jaw pain since last night. EXAM: CHEST - 2 VIEW COMPARISON:  Chest radiograph 08/16/2022. FINDINGS: PA and lateral views. Lung volumes and mediastinal contours remain normal. Visualized tracheal air column is within normal limits. Lung markings appear stable from last year and both lungs appear clear. No pneumothorax or pleural effusion. No acute osseous abnormality identified. Negative visible bowel gas. IMPRESSION: No acute cardiopulmonary abnormality. Electronically Signed   By: VEAR Hurst M.D.   On: 10/08/2023 03:55    Disposition Pt is being discharged home today in good condition.  Follow-up Plans & Appointments  Follow-up Information     Schedule an appointment as soon as possible for a visit  with Your PCP.                 Discharge Instructions     Diet - low sodium heart healthy   Complete by: As directed    Discharge instructions   Complete by: As directed    Radial Site Care Refer to this sheet in the next few weeks. These instructions provide you with information on caring for yourself after your procedure. Your caregiver may also give you more specific instructions. Your treatment has been planned according to current medical practices, but problems sometimes occur. Call your caregiver if you have any problems or questions after your procedure. HOME CARE INSTRUCTIONS You may shower the day after the procedure. Remove the bandage (dressing) and gently wash the site with plain soap and water. Gently pat the site dry.   Do not apply powder or lotion to the site.  Do not submerge the affected site in water for 3 to 5 days.  Inspect the site at least twice daily.  Do not flex or bend the affected arm for 24 hours.  No lifting over 5 pounds (2.3 kg) for 5 days after your procedure.  Do not drive home if you are discharged the same day of the procedure. Have someone else drive you.  You may drive 24 hours after the procedure unless otherwise instructed by your caregiver.  What to expect: Any bruising will usually fade within 1 to 2 weeks.  Blood that collects in the tissue (hematoma) may be painful to the touch. It should usually decrease in size and tenderness within 1 to 2 weeks.  SEEK IMMEDIATE MEDICAL CARE IF: You have unusual pain at the radial site.  You have redness, warmth, swelling, or pain at the  radial site.  You have drainage (other than a small amount of blood on the dressing).  You have chills.  You have a fever or persistent symptoms for more than 72 hours.  You have a fever and your symptoms suddenly get worse.  Your arm becomes pale, cool, tingly, or numb.  You have heavy bleeding from the site. Hold pressure on the site.   Increase activity slowly   Complete by: As directed        Discharge Medications Allergies as of 10/08/2023       Reactions   Statins Other (See Comments)   cramps, muscle aches, tinnitus        Medication List     STOP taking these medications    metoprolol  tartrate 25 MG tablet Commonly known as: LOPRESSOR        TAKE these medications    acetaminophen  500 MG tablet Commonly known as: TYLENOL  Take 1,000 mg by mouth daily as needed for mild pain (pain score 1-3) or moderate pain (pain score 4-6).   amLODipine 5 MG tablet Commonly known as: NORVASC Take 5 mg by mouth at bedtime.   ezetimibe  10 MG tablet Commonly known as: Zetia  Take 1 tablet (10 mg total) by mouth daily.   lisinopril 40 MG tablet Commonly known as: ZESTRIL Take 40 mg by  mouth daily.   loratadine 10 MG tablet Commonly known as: CLARITIN Take 10 mg by mouth daily.   metoprolol  succinate 25 MG 24 hr tablet Commonly known as: Toprol  XL Take 0.5 tablets (12.5 mg total) by mouth daily.   omeprazole 20 MG capsule Commonly known as: PRILOSEC Take 20 mg by mouth at bedtime.   Pitavastatin Calcium 4 MG Tabs Take 4 mg by mouth at bedtime.       Outstanding Labs/Studies  FLP/LFTs with PCP  Duration of Discharge Encounter: APP Time: 15 minutes   Signed, Manuelita Rummer, NP 10/08/2023, 10:59 AM

## 2023-10-09 ENCOUNTER — Encounter (HOSPITAL_COMMUNITY): Payer: Self-pay | Admitting: Cardiovascular Disease

## 2023-10-09 MED FILL — Lidocaine HCl Local Preservative Free (PF) Inj 1%: INTRAMUSCULAR | Qty: 30 | Status: AC

## 2023-10-31 ENCOUNTER — Other Ambulatory Visit: Payer: Self-pay

## 2023-10-31 NOTE — Telephone Encounter (Signed)
 Received fax from [facility] to refill Toprol  12.5 mg daily but pt is not a cardiology patient.  Metoprolol  tartrate was prescribed elsewhere, listed as historical provider.  Reviewed with Dr. Verlin, pt was seen by cardiology at the hospital, cath'ed, and sent home to follow up with PCP.  No follow up in cardiology is necessary.  While he was there, metoprolol  tartrate was changed to the Toprol  12.5 due to bradycardia.  Sent the fax back to [facility] with this information.

## 2023-11-04 ENCOUNTER — Other Ambulatory Visit: Payer: Self-pay | Admitting: Cardiovascular Disease

## 2023-11-06 MED ORDER — METOPROLOL SUCCINATE ER 25 MG PO TB24: 12.5000 mg | ORAL_TABLET | Freq: Every day | ORAL | 1 refills | Status: AC

## 2024-01-02 ENCOUNTER — Other Ambulatory Visit: Payer: Self-pay | Admitting: Cardiology
